# Patient Record
Sex: Female | Born: 1985 | Race: White | Hispanic: No | Marital: Married | Smoking: Never smoker
Health system: Southern US, Community
[De-identification: ages and names within clinical notes are randomized; demographics above are authoritative.]

## PROBLEM LIST (undated history)

## (undated) DIAGNOSIS — G43109 Migraine with aura, not intractable, without status migrainosus: Secondary | ICD-10-CM

## (undated) HISTORY — PX: WISDOM TOOTH EXTRACTION: SHX21

## (undated) HISTORY — DX: Migraine with aura, not intractable, without status migrainosus: G43.109

---

## 2015-07-11 ENCOUNTER — Encounter (HOSPITAL_COMMUNITY): Payer: Self-pay | Admitting: Emergency Medicine

## 2015-07-11 ENCOUNTER — Emergency Department (HOSPITAL_COMMUNITY)
Admission: EM | Admit: 2015-07-11 | Discharge: 2015-07-12 | Disposition: A | Discharge status: Home / Self Care | Payer: No Typology Code available for payment source | Attending: Emergency Medicine | Admitting: Emergency Medicine

## 2015-07-11 DIAGNOSIS — G43109 Migraine with aura, not intractable, without status migrainosus: Secondary | ICD-10-CM | POA: Diagnosis not present

## 2015-07-11 DIAGNOSIS — M542 Cervicalgia: Secondary | ICD-10-CM | POA: Insufficient documentation

## 2015-07-11 DIAGNOSIS — Z3202 Encounter for pregnancy test, result negative: Secondary | ICD-10-CM | POA: Diagnosis not present

## 2015-07-11 DIAGNOSIS — R51 Headache: Secondary | ICD-10-CM | POA: Diagnosis present

## 2015-07-11 LAB — CBC
HEMATOCRIT: 39 % (ref 36.0–46.0)
HEMOGLOBIN: 13 g/dL (ref 12.0–15.0)
MCH: 29.1 pg (ref 26.0–34.0)
MCHC: 33.3 g/dL (ref 30.0–36.0)
MCV: 87.2 fL (ref 78.0–100.0)
Platelets: 292 10*3/uL (ref 150–400)
RBC: 4.47 MIL/uL (ref 3.87–5.11)
RDW: 13.2 % (ref 11.5–15.5)
WBC: 8.7 10*3/uL (ref 4.0–10.5)

## 2015-07-11 LAB — URINALYSIS, ROUTINE W REFLEX MICROSCOPIC
Bilirubin Urine: NEGATIVE
GLUCOSE, UA: NEGATIVE mg/dL
HGB URINE DIPSTICK: NEGATIVE
Ketones, ur: NEGATIVE mg/dL
LEUKOCYTES UA: NEGATIVE
Nitrite: NEGATIVE
PH: 6 (ref 5.0–8.0)
Protein, ur: NEGATIVE mg/dL
Specific Gravity, Urine: 1.005 (ref 1.005–1.030)
Urobilinogen, UA: 0.2 mg/dL (ref 0.0–1.0)

## 2015-07-11 LAB — COMPREHENSIVE METABOLIC PANEL
ALK PHOS: 68 U/L (ref 38–126)
ALT: 20 U/L (ref 14–54)
AST: 27 U/L (ref 15–41)
Albumin: 4.1 g/dL (ref 3.5–5.0)
Anion gap: 8 (ref 5–15)
BILIRUBIN TOTAL: 0.7 mg/dL (ref 0.3–1.2)
BUN: 9 mg/dL (ref 6–20)
CALCIUM: 9.5 mg/dL (ref 8.9–10.3)
CO2: 26 mmol/L (ref 22–32)
CREATININE: 0.79 mg/dL (ref 0.44–1.00)
Chloride: 101 mmol/L (ref 101–111)
Glucose, Bld: 81 mg/dL (ref 65–99)
Potassium: 3.4 mmol/L — ABNORMAL LOW (ref 3.5–5.1)
SODIUM: 135 mmol/L (ref 135–145)
TOTAL PROTEIN: 6.9 g/dL (ref 6.5–8.1)

## 2015-07-11 LAB — POC URINE PREG, ED: Preg Test, Ur: NEGATIVE

## 2015-07-11 MED ORDER — ONDANSETRON 4 MG PO TBDP
4.0000 mg | ORAL_TABLET | Freq: Once | ORAL | Status: AC | PRN
  Administered 2015-07-11: 4 mg via ORAL

## 2015-07-11 MED ORDER — ONDANSETRON 4 MG PO TBDP
ORAL_TABLET | ORAL | Status: AC
  Filled 2015-07-11: qty 1

## 2015-07-11 NOTE — ED Provider Notes (Signed)
ghCSN: [id number]     Arrival date & time 07/11/15  2006 History  By signing my name below, I, Doreatha Martin, attest that this documentation has been prepared under the direction and in the presence of Rolland Porter, MD. Electronically Signed: Doreatha Martin, ED Scribe. 07/11/2015. 11:13 PM.    Chief Complaint  Patient presents with  . Diplopia  . Headache  . Nausea   The history is provided by the patient. No language interpreter was used.   HPI Comments: Bayleigh Loflin is a 29 y.o. female with no chronic medical conditions who presents to the Emergency Department complaining of an episode of visual disturbance, described as right-sided flashing circles of light with blurred vision in bilateral eyes, onset this evening after returning home from a trampoline park with associated HA at the base of the occiput, nausea, dizziness, neck pain, photophobia. Pt notes she was at the park for an hour and a half; no head injury or acute trauma. She states that her symptoms initially increased in intensity over time. She states that she still saw the lights when she closed her eyes. Pt notes that her symptoms have been subsiding and drifting more to the right since onset. No Hx of similar visual disturbance. Pt notes Hx of similar intermittent dull, aching, HAs at the base of the skull for 6 months, as well as similar neck pain, photophobia. Pt reports that her symptoms have now completely resolved. She notes that she has an appointment with Neurology in 5 days for the HAs. No everyday medications, no oral contraceptive use. Pt is a non-smoker. No FHx of cerebral hemorrhage, TIA, aneurisms. She denies focal weakness, numbness, difficulty ambulating.   History reviewed. No pertinent past medical history. History reviewed. No pertinent past surgical history. No family history on file. Social History  Substance Use Topics  . Smoking status: Never Smoker   . Smokeless tobacco: None  . Alcohol Use: No   OB History     No data available     Review of Systems  Constitutional: Negative for fever, chills, diaphoresis, appetite change and fatigue.  HENT: Negative for mouth sores, sore throat and trouble swallowing.   Eyes: Positive for photophobia and visual disturbance.  Respiratory: Negative for cough, chest tightness, shortness of breath and wheezing.   Cardiovascular: Negative for chest pain.  Gastrointestinal: Positive for nausea. Negative for vomiting, abdominal pain, diarrhea and abdominal distention.  Endocrine: Negative for polydipsia, polyphagia and polyuria.  Genitourinary: Negative for dysuria, frequency and hematuria.  Musculoskeletal: Positive for neck pain. Negative for gait problem.  Skin: Negative for color change, pallor and rash.  Neurological: Positive for dizziness and headaches. Negative for syncope, weakness, light-headedness and numbness.       +tingling to the neck and back  Hematological: Does not bruise/bleed easily.  Psychiatric/Behavioral: Negative for behavioral problems and confusion.   Allergies  Ceclor; Corn-containing products; Milk-related compounds; Soy allergy; and Sulfa antibiotics  Home Medications   Prior to Admission medications   Not on File   BP 101/62 mmHg  Pulse 77  Temp(Src) 97.9 F (36.6 C) (Oral)  Resp 18  Ht 5' 5.5" (1.664 m)  Wt 126 lb (57.153 kg)  BMI 20.64 kg/m2  SpO2 99%  LMP 06/25/2015 Physical Exam  Constitutional: She is oriented to person, place, and time. She appears well-developed and well-nourished. No distress.  HENT:  Head: Normocephalic.  Eyes: Conjunctivae are normal. Pupils are equal, round, and reactive to light. No scleral icterus.  Neck: Normal range  of motion. Neck supple. No thyromegaly present.  Cardiovascular: Normal rate and regular rhythm.  Exam reveals no gallop and no friction rub.   No murmur heard. Pulmonary/Chest: Effort normal and breath sounds normal. No respiratory distress. She has no wheezes. She has no  rales.  Abdominal: Soft. Bowel sounds are normal. She exhibits no distension. There is no tenderness. There is no rebound.  Musculoskeletal: Normal range of motion.  Neurological: She is alert and oriented to person, place, and time.  Skin: Skin is warm and dry. No rash noted.  Psychiatric: She has a normal mood and affect. Her behavior is normal.  Nursing note and vitals reviewed.  ED Course  Procedures (including critical care time) DIAGNOSTIC STUDIES: Oxygen Saturation is 100% on RA, normal by my interpretation.    COORDINATION OF CARE: 11:10 PM Discussed treatment plan with pt at bedside and pt agreed to plan.   Labs Review Labs Reviewed  COMPREHENSIVE METABOLIC PANEL - Abnormal; Notable for the following:    Potassium 3.4 (*)    All other components within normal limits  URINALYSIS, ROUTINE W REFLEX MICROSCOPIC (NOT [facility])  CBC  POC URINE PREG, ED    Imaging Review Ct Head Wo Contrast  07/12/2015  CLINICAL DATA:  29 year old female with intermittent headache for 7 months. New visual changes today. Initial encounter. EXAM: CT HEAD WITHOUT CONTRAST TECHNIQUE: Contiguous axial images were obtained from the base of the skull through the vertex without intravenous contrast. COMPARISON:  None. FINDINGS: Visualized paranasal sinuses and mastoids are clear. No osseous abnormality identified. Visualized orbit soft tissues are within normal limits. Visualized scalp soft tissues are within normal limits. Cerebral volume is normal. No midline shift, ventriculomegaly, mass effect, evidence of mass lesion, intracranial hemorrhage or evidence of cortically based acute infarction. Gray-white matter differentiation is within normal limits throughout the brain. No suspicious intracranial vascular hyperdensity. IMPRESSION: Normal noncontrast CT appearance of the brain. Electronically Signed   By: Odessa FlemingH  Hall M.D.   On: 07/12/2015 00:28   I have personally reviewed and evaluated these images and lab  results as part of my medical decision-making.  MDM   Final diagnoses:  Basilar migraine    Patient presents essentially symptom-free other than mild headache. Describes binocular diplopia. This would be against this being a retinal abnormality. I see no retinal abnormalities on exam. No structural abnormality is on CT. Normal repeat neurological exam with normal visual fields. Patient with scotoma nausea and posterior headache intermittent over several months. Likely basilar migraine. Has appointment with neurology on Monday. Appropriate for discharge without further studies.  Rolland PorterMark Monseratt Ledin, MD 07/12/15 [phone]

## 2015-07-11 NOTE — ED Notes (Signed)
MD to see and assess patient before RN assessment. 

## 2015-07-11 NOTE — ED Notes (Signed)
Onset today 1900 headache and bilateral double vision flashing light. Symptoms resolved however continued to feel nausea. Previously at a trampoline park today no trauma.

## 2015-07-12 ENCOUNTER — Emergency Department (HOSPITAL_COMMUNITY): Payer: No Typology Code available for payment source

## 2015-07-12 MED ORDER — ONDANSETRON 4 MG PO TBDP
4.0000 mg | ORAL_TABLET | Freq: Once | ORAL | Status: AC
  Administered 2015-07-12: 4 mg via ORAL
  Filled 2015-07-12: qty 1

## 2015-07-12 MED ORDER — IBUPROFEN 800 MG PO TABS
800.0000 mg | ORAL_TABLET | Freq: Once | ORAL | Status: AC
  Administered 2015-07-12: 800 mg via ORAL
  Filled 2015-07-12: qty 1

## 2015-07-12 MED ORDER — ONDANSETRON HCL 4 MG/2ML IJ SOLN
4.0000 mg | Freq: Once | INTRAMUSCULAR | Status: DC
  Filled 2015-07-12: qty 2

## 2015-07-12 NOTE — Discharge Instructions (Signed)

## 2015-07-12 NOTE — ED Notes (Signed)
Reviewed discharge instructions with patient, who verbalized understanding.

## 2015-07-16 ENCOUNTER — Encounter: Payer: Self-pay | Admitting: Neurology

## 2015-07-16 ENCOUNTER — Ambulatory Visit (INDEPENDENT_AMBULATORY_CARE_PROVIDER_SITE_OTHER): Payer: No Typology Code available for payment source | Admitting: Neurology

## 2015-07-16 VITALS — BP 107/73 | HR 75 | Ht 65.0 in | Wt 127.0 lb

## 2015-07-16 DIAGNOSIS — G43119 Migraine with aura, intractable, without status migrainosus: Secondary | ICD-10-CM

## 2015-07-16 DIAGNOSIS — G43109 Migraine with aura, not intractable, without status migrainosus: Secondary | ICD-10-CM | POA: Insufficient documentation

## 2015-07-16 HISTORY — DX: Migraine with aura, not intractable, without status migrainosus: G43.109

## 2015-07-16 MED ORDER — FOLIC ACID 1 MG PO TABS: 1.0000 mg | ORAL_TABLET | Freq: Every day | ORAL | Status: DC

## 2015-07-16 MED ORDER — TOPIRAMATE 25 MG PO TABS: ORAL_TABLET | ORAL | Status: DC

## 2015-07-16 NOTE — Progress Notes (Signed)
Reason for visit: Headache  Referring physician: Dr. Betty SwazilandJordan  Dannial MonarchMelissa A Moore is a 29 y.o. female  History of present illness:  [name] is a 29 year old right-handed white female with a history of headaches that began around January 2016. The patient was under some stress at the time of onset the headaches. The headaches are described as a pressure sensation that come up in the back of the head and are involved with some neck stiffness. The patient has some tingly sensations into the shoulders at times, no pain or discomfort down arms. The patient has virtually daily discomfort, the patient has had a period of up to 4 months when she was not able to work because of this. She may feel spacey with the headaches. Light and sound bother her, and bright flashing lights and some certain odors may worsen the headache. The patient has had at least one event associated with flashing lights in the vision that would migrate from one side to the next. The patient had an increase in the headache following this. The patient does not have headaches in the front of the head. She has gone to the emergency room on 07/12/2015, CT of the head was done and was unremarkable. The patient denies any weakness of the extremities, or difficulty controlling the bowels or the bladder. The patient takes some Advil for the headache, she has not been on any other medications. The patient is sent to this office for an evaluation. She has had some general improvement in the severity of discomfort recently, over the last 2 days she has not had any headache.  Past Medical History  Diagnosis Date  . 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 Wisdom tooth extraction      Family History  Problem Relation Age of Onset  . Hypertension Mother   . Dementia Maternal Grandmother   . Diabetes Maternal Grandmother     type II  . Colon cancer Maternal Grandfather   . Prostate cancer  Paternal Grandfather   . Migraines Other     Social history:  reports that she has never smoked. She has never used smokeless tobacco. She reports that she does not drink alcohol or use illicit drugs.  Medications:  Prior to Admission medications   Medication Sig Start Date End Date Taking? Authorizing Provider  LORazepam (ATIVAN) 0.5 MG tablet Take 0.5 mg by mouth daily as needed.    Yes Historical Provider, MD      Allergies  Allergen Reactions  . Ceclor [Cefaclor]   . Corn-Containing Products   . Milk-Related Compounds   . Soy Allergy   . Sulfa Antibiotics     ROS:  Out of a complete 14 system review of symptoms, the patient complains only of the following symptoms, and all other reviewed systems are negative.  Fevers, chills, fatigue Palpitations of the heart Ringing in the ears, spinning sensations Blurred vision Shortness of breath Feeling cold, increased thirst Muscle cramps, aching muscles Memory loss, confusion, headache, weakness, dizziness, tremor Anxiety, disinterest in activities Sleepiness  Blood pressure 107/73, pulse 75, height 5\' 5"  (1.651 m), weight 127 lb (57.607 kg), last menstrual period 06/25/2015.  Physical Exam  General: The patient is alert and cooperative at the time of the examination.  Eyes: Pupils are equal, round, and reactive to light. Discs are flat bilaterally.  Neck: The neck is supple, no carotid bruits are noted.  Respiratory: The respiratory examination is  clear.  Cardiovascular: The cardiovascular examination reveals a regular rate and rhythm, no obvious murmurs or rubs are noted.  Neuromuscular: Range of movement of the cervical spine was full. Significant crepitus/dislocation of the left temporomandibular joint is noted.  Skin: Extremities are without significant edema.  Neurologic Exam  Mental status: The patient is alert and oriented x 3 at the time of the examination. The patient has apparent normal recent and remote  memory, with an apparently normal attention span and concentration ability.  Cranial nerves: Facial symmetry is present. There is good sensation of the face to pinprick and soft touch bilaterally. The strength of the facial muscles and the muscles to head turning and shoulder shrug are normal bilaterally. Speech is well enunciated, no aphasia or dysarthria is noted. Extraocular movements are full. Visual fields are full. The tongue is midline, and the patient has symmetric elevation of the soft palate. No obvious hearing deficits are noted.  Motor: The motor testing reveals 5 over 5 strength of all 4 extremities. Good symmetric motor tone is noted throughout.  Sensory: Sensory testing is intact to pinprick, soft touch, vibration sensation, and position sense on all 4 extremities. No evidence of extinction is noted.  Coordination: Cerebellar testing reveals good finger-nose-finger and heel-to-shin bilaterally.  Gait and station: Gait is normal. Tandem gait is normal. Romberg is negative. No drift is seen.  Reflexes: Deep tendon reflexes are symmetric and normal bilaterally. Toes are downgoing bilaterally.   CT head 07/12/2015:  IMPRESSION: Normal noncontrast CT appearance of the brain.   Assessment/Plan:  1. Classic migraine  The patient appears to have at least one component of classic migraine headache, there may be some underlying tension headache as well. The patient will be placed on Topamax, she may take Advil for the headache. She will follow-up in 3-4 months. She will contact our office if further issues arise.  Marlan Palau MD 07/16/2015 8:04 PM  [facility] [address] [place], [place] [address]  Phone [phone] Fax [phone]

## 2015-07-16 NOTE — Patient Instructions (Addendum)
 We will start topamax for the headache.   Topamax (topiramate) is a seizure medication that has an FDA approval for seizures and for migraine headache. Potential side effects of this medication include weight loss, cognitive slowing, tingling in the fingers and toes, and carbonated drinks will taste bad. If any significant side effects are noted on this drug, please contact our office.  Migraine Headache A migraine headache is an intense, throbbing pain on one or both sides of your head. A migraine can last for 30 minutes to several hours. CAUSES  The exact cause of a migraine headache is not always known. However, a migraine may be caused when nerves in the brain become irritated and release chemicals that cause inflammation. This causes pain. Certain things may also trigger migraines, such as:  Alcohol.  Smoking.  Stress.  Menstruation.  Aged cheeses.  Foods or drinks that contain nitrates, glutamate, aspartame, or tyramine.  Lack of sleep.  Chocolate.  Caffeine.  Hunger.  Physical exertion.  Fatigue.  Medicines used to treat chest pain (nitroglycerine), birth control pills, estrogen, and some blood pressure medicines. SIGNS AND SYMPTOMS  Pain on one or both sides of your head.  Pulsating or throbbing pain.  Severe pain that prevents daily activities.  Pain that is aggravated by any physical activity.  Nausea, vomiting, or both.  Dizziness.  Pain with exposure to bright lights, loud noises, or activity.  General sensitivity to bright lights, loud noises, or smells. Before you get a migraine, you may get warning signs that a migraine is coming (aura). An aura may include:  Seeing flashing lights.  Seeing bright spots, halos, or zigzag lines.  Having tunnel vision or blurred vision.  Having feelings of numbness or tingling.  Having trouble talking.  Having muscle weakness. DIAGNOSIS  A migraine headache is often diagnosed based  on:  Symptoms.  Physical exam.  A CT scan or MRI of your head. These imaging tests cannot diagnose migraines, but they can help rule out other causes of headaches. TREATMENT Medicines may be given for pain and nausea. Medicines can also be given to help prevent recurrent migraines.  HOME CARE INSTRUCTIONS  Only take over-the-counter or prescription medicines for pain or discomfort as directed by your health care provider. The use of long-term narcotics is not recommended.  Lie down in a dark, quiet room when you have a migraine.  Keep a journal to find out what may trigger your migraine headaches. For example, write down:  What you eat and drink.  How much sleep you get.  Any change to your diet or medicines.  Limit alcohol consumption.  Quit smoking if you smoke.  Get 7-9 hours of sleep, or as recommended by your health care provider.  Limit stress.  Keep lights dim if bright lights bother you and make your migraines worse. SEEK IMMEDIATE MEDICAL CARE IF:   Your migraine becomes severe.  You have a fever.  You have a stiff neck.  You have vision loss.  You have muscular weakness or loss of muscle control.  You start losing your balance or have trouble walking.  You feel faint or pass out.  You have severe symptoms that are different from your first symptoms. MAKE SURE YOU:   Understand these instructions.  Will watch your condition.  Will get help right away if you are not doing well or get worse.   This information is not intended to replace advice given to you by your health care provider. Make   Make sure you discuss any questions you have with your health care provider.   Document Released: 09/15/2005 Document Revised: 10/06/2014 Document Reviewed: 05/23/2013 Elsevier Interactive Patient Education [employer]2016 Elsevier Inc.

## 2015-10-08 ENCOUNTER — Ambulatory Visit: Payer: No Typology Code available for payment source | Admitting: Neurology

## 2015-10-22 ENCOUNTER — Encounter: Payer: Self-pay | Admitting: Neurology

## 2015-10-22 ENCOUNTER — Ambulatory Visit (INDEPENDENT_AMBULATORY_CARE_PROVIDER_SITE_OTHER): Payer: No Typology Code available for payment source | Admitting: Neurology

## 2015-10-22 VITALS — BP 84/58 | HR 78 | Ht 65.5 in | Wt 124.0 lb

## 2015-10-22 DIAGNOSIS — M5481 Occipital neuralgia: Secondary | ICD-10-CM | POA: Diagnosis not present

## 2015-10-22 DIAGNOSIS — H539 Unspecified visual disturbance: Secondary | ICD-10-CM

## 2015-10-22 DIAGNOSIS — R202 Paresthesia of skin: Secondary | ICD-10-CM | POA: Diagnosis not present

## 2015-10-22 DIAGNOSIS — G43109 Migraine with aura, not intractable, without status migrainosus: Secondary | ICD-10-CM

## 2015-10-22 NOTE — Progress Notes (Signed)
NEUROLOGY CONSULTATION NOTE  Jackie Moore MRN: [id number] DOB: 06-26-1986  Referring provider: Dr. Swaziland Primary care provider: Dr. Swaziland  Reason for consult:  Headache, visual disturbance  HISTORY OF PRESENT ILLNESS: Jackie Moore is a 30 year old right-handed female who presents for second opinion regarding headaches.  History obtained by patient and ED note.  Labs and imaging of head CT reviewed.  She and her husband moved here from [facility] about 3 months ago.  When she had moved into a new apartment in [place] in January 2016, she began experiencing multiple symptomatology.  She noted neck tension, which later developed into electric shooting pain in the occiput, accompanied by paresthesias on the top and back of the head.  Due to the discomfort, she began experiencing panic attacks.  She then developed altered sensorium.  She felt more withdrawn.  She had thoughts that she was being stabbed in the leg, despite not actually feeling any pain.  She also would have the false belief that people around her were experiencing stabbing pain in their legs.  She has had increased photosensitivity and floaters when she goes outside.  When she first moved here, she went to the ED on 07/11/15 for an episode of visual disturbance where she noted shimmering light moving to the right visual field of both eyes.  It lasted about 45 minutes and resolved.  CT of the head was normal.  CBC and CMP were unremarkable.  When she moved to [place], most of the symptoms resolved.  However, she still notes the shooting occipital pain and tingling in the back of her head.  It lasts about 30 minutes and occurs once a day, usually while she is sitting.  She also notes tingling in the 5th digits of both hands, which radiates up the arms.  She reports prior history of migraines in college.  Her mother had migraines.  Her great grandmother had cerebral aneurysm.  She denies history of seizures.  PAST MEDICAL  HISTORY: Past Medical History  Diagnosis Date  . 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 Wisdom tooth extraction      MEDICATIONS: No current outpatient prescriptions on file prior to visit.   No current facility-administered medications on file prior to visit.    ALLERGIES: Allergies  Allergen Reactions  . Ceclor [Cefaclor]   . Corn-Containing Products   . Milk-Related Compounds   . Soy Allergy   . Sulfa Antibiotics     FAMILY HISTORY: Family History  Problem Relation Age of Onset  . Hypertension Mother   . Dementia Maternal Grandmother   . Diabetes Maternal Grandmother     type II  . Colon cancer Maternal Grandfather   . Prostate cancer Paternal Grandfather   . Migraines Other     SOCIAL HISTORY: Social History   Social History  . Marital Status: Married    Spouse Name: N/A  . Number of Children: 0  . Years of Education: bachelor's   Occupational History  . self-employed    Social History Main Topics  . Smoking status: Never Smoker   . Smokeless tobacco: Never Used  . Alcohol Use: No  . Drug Use: No  . Sexual Activity: Not on file   Other Topics Concern  . Not on file   Social History Narrative   Patient does not drink caffeine.   Patient is right handed.     REVIEW OF SYSTEMS: Constitutional:  No fevers, chills, or sweats, no generalized fatigue, change in appetite Eyes: No visual changes, double vision, eye pain Ear, nose and throat: No hearing loss, ear pain, nasal congestion, sore throat Cardiovascular: No chest pain, palpitations Respiratory:  No shortness of breath at rest or with exertion, wheezes GastrointestinaI: No nausea, vomiting, diarrhea, abdominal pain, fecal incontinence Genitourinary:  No dysuria, urinary retention or frequency Musculoskeletal:  No neck pain, back pain Integumentary: No rash, pruritus, skin lesions Neurological: as above Psychiatric: No  depression, insomnia, anxiety Endocrine: No palpitations, fatigue, diaphoresis, mood swings, change in appetite, change in weight, increased thirst Hematologic/Lymphatic:  No anemia, purpura, petechiae. Allergic/Immunologic: no itchy/runny eyes, nasal congestion, recent allergic reactions, rashes  PHYSICAL EXAM: Filed Vitals:   10/22/15 1304  BP: 84/58  Pulse: 78   General: No acute distress.  Patient appears well-groomed.  Head:  Normocephalic/atraumatic Eyes:  fundi unremarkable, without vessel changes, exudates, hemorrhages or papilledema. Neck: supple, no paraspinal tenderness, full range of motion Back: No paraspinal tenderness Heart: regular rate and rhythm Lungs: Clear to auscultation bilaterally. Vascular: No carotid bruits. Neurological Exam: Mental status: alert and oriented to person, place, and time, recent and remote memory intact, fund of knowledge intact, attention and concentration intact, speech fluent and not dysarthric, language intact. Cranial nerves: CN I: not tested CN II: pupils equal, round and reactive to light, visual fields intact, fundi unremarkable, without vessel changes, exudates, hemorrhages or papilledema. CN III, IV, VI:  full range of motion, no nystagmus, no ptosis CN V: facial sensation intact CN VII: upper and lower face symmetric CN VIII: hearing intact CN IX, X: gag intact, uvula midline CN XI: sternocleidomastoid and trapezius muscles intact CN XII: tongue midline Bulk & Tone: normal, no fasciculations. Motor:  5/5 throughout Sensation: temperature and vibration sensation intact. Deep Tendon Reflexes:  2+ throughout, toes downgoing.  Finger to nose testing:  Without dysmetria.  Heel to shin:  Without dysmetria.  Gait:  Normal station and stride.  Able to turn and tandem walk. Romberg negative. Positive Tinel's sign at elbows.  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 Numbness and tingling in upper  extremities.  Consider ulnar neuropathy  PLAN: 1.  Due to the new headaches, visual disturbance, cognitive disturbance and paresthesias, will get MRI of brain with and without contrast.  2.  Consider occipital nerve block if gets worse 3.  For hands, advised not to lean on elbows.  Consider NCV-EMG if gets worse 4.  If MRI unremarkable, follow up as needed.  Thank you for allowing me to take part in the care of this patient.  Shon Millet, DO  CC:  Betty Swaziland, MD

## 2015-10-22 NOTE — Patient Instructions (Addendum)
I think the tingling and electric shocks in the back of your head may be occipital neuralgia.  If it becomes more frequent, I can give you an injection to help relieve the inflammation of that nerve in the back of the head.  The tingling in the hands may be due to pinching of the nerve around the elbow.  Be careful not to lean on your elbows.  If it gets worse, we can order a nerve study We will get MRI of the brain with and without contrast. Follow up as needed

## 2015-10-22 NOTE — Progress Notes (Signed)
Chart forwarded.  

## 2015-10-24 ENCOUNTER — Telehealth: Payer: Self-pay

## 2015-10-24 ENCOUNTER — Ambulatory Visit: Payer: Self-pay | Admitting: Neurology

## 2015-10-24 NOTE — Telephone Encounter (Signed)
Patient did not come to a f/u appointment today.  

## 2015-11-01 ENCOUNTER — Encounter: Payer: Self-pay | Admitting: Neurology

## 2015-11-26 ENCOUNTER — Telehealth: Payer: Self-pay | Admitting: Neurology

## 2015-11-26 DIAGNOSIS — R9389 Abnormal findings on diagnostic imaging of other specified body structures: Secondary | ICD-10-CM

## 2015-11-26 NOTE — Addendum Note (Signed)
Addended by: Sheilah Mins A on: 11/26/2015 02:45 PM   Modules accepted: Orders

## 2015-11-26 NOTE — Telephone Encounter (Signed)
Patient called back and left vm w/ front desk stating she will be able to go get MRI w/ contrast. Please see next telephone note.

## 2015-11-26 NOTE — Telephone Encounter (Signed)
Discussed MRI results.  It showed small nonspecific white matter changes.  I would like to get MRI of brain WITH contrast to further evaluate.  I would also like to schedule repeat MRI of brain WITH AND WITHOUT contrast in 6 months with office follow up soon afterwards.

## 2015-11-26 NOTE — Telephone Encounter (Signed)
VM-PT left message that she can get another MRI/Dawn CB# [phone]

## 2015-11-26 NOTE — Telephone Encounter (Signed)
Attempted to order. Called patient. She states order was supposed to be w/ and w/o but she cancelled the w/ at the site due to concerns about contrast dye. States they told her she can come back in and have the contrast portion done if she needs since that is how original order stated scan. Pt will contact [facility] to follow up and will call me back.

## 2015-11-27 NOTE — Telephone Encounter (Signed)
Dr. Everlena Cooper requested MRI cervical spine w/ and w/o be added. Spoke with patient. She is aware. Will call GI herself this afternoon to schedule both.

## 2015-11-27 NOTE — Telephone Encounter (Signed)
Pt uses [facility] Not [facility]. MRIs printed and faxed to [facility].

## 2015-11-27 NOTE — Addendum Note (Signed)
Addended by: Sheilah Mins A on: 11/27/2015 11:06 AM   Modules accepted: Orders

## 2015-11-27 NOTE — Addendum Note (Signed)
Addended by: Sheilah Mins A on: 11/27/2015 11:58 AM   Modules accepted: Orders

## 2015-12-05 ENCOUNTER — Telehealth: Payer: Self-pay

## 2015-12-06 NOTE — Telephone Encounter (Signed)
Opened in error

## 2015-12-07 ENCOUNTER — Inpatient Hospital Stay: Admission: RE | Admit: 2015-12-07 | Payer: No Typology Code available for payment source | Source: Ambulatory Visit

## 2015-12-07 ENCOUNTER — Other Ambulatory Visit: Payer: No Typology Code available for payment source

## 2015-12-19 ENCOUNTER — Telehealth: Payer: Self-pay

## 2015-12-19 NOTE — Telephone Encounter (Signed)
-----   Message from Drema DallasAdam R Jaffe, DO sent at 12/19/2015 12:02 PM EDT ----- MRI of brain shows that the spots do not light up.  MRI of cervical spine looks okay.  At this point, MRI findings are of uncertain significance.  I would like to repeat MRI of brain with and without contrast in 6 months and follow up with me right afterwards

## 2015-12-19 NOTE — Telephone Encounter (Signed)
Left message on machine for pt to return call to the office.  

## 2016-06-03 ENCOUNTER — Telehealth: Payer: Self-pay

## 2016-06-03 DIAGNOSIS — R9089 Other abnormal findings on diagnostic imaging of central nervous system: Secondary | ICD-10-CM

## 2016-06-03 NOTE — Telephone Encounter (Signed)
MRI order printed and faxed to [facility] phone: [phone] Fax: [phone].  Left message on pt's machine to return call.

## 2016-06-03 NOTE — Telephone Encounter (Signed)
-----   Message from Jada A Fox, CMA sent at 11/26/2015  2:32 PM EST ----- Regarding: MRI MRI brain w/ and w/o 

## 2016-06-03 NOTE — Telephone Encounter (Signed)
-----   Message from Richarda OverlieJada A Cullin Dishman, [place] sent at 11/26/2015  2:32 PM EST ----- Regarding: MRI MRI brain w/ and w/o

## 2016-06-03 NOTE — Telephone Encounter (Signed)
Attempted to contact pt again. Pt's guarentor's DOB is not in chart. Can not initiate any P.A. Without info. Will need to collect when pt calls back.

## 2016-06-12 ENCOUNTER — Telehealth: Payer: Self-pay

## 2016-06-12 NOTE — Telephone Encounter (Signed)
Letter sent.

## 2016-06-12 NOTE — Telephone Encounter (Signed)
Have been attempting to reach pt to find out guarantor's DOB. Have been unsuccessful. Many messages left. Will draft and send letter.

## 2017-09-11 IMAGING — CT CT HEAD W/O CM
2 series · 16 of 30 positions shown, 20 images · non-contrast
Comparison: None.

CLINICAL DATA: 28-year-old female with intermittent headache for 7
months. New visual changes today. Initial encounter.

EXAM:
CT HEAD WITHOUT CONTRAST
TECHNIQUE: Contiguous axial images were obtained from the base of the skull
through the vertex without intravenous contrast.

[Series 201: head w/o, idose (1) · axial · non-contrast · 0.49mm/px · z∈[+91,+211]mm · 13 of 28 slices shown, 17 images]
[im 2/28  brain]
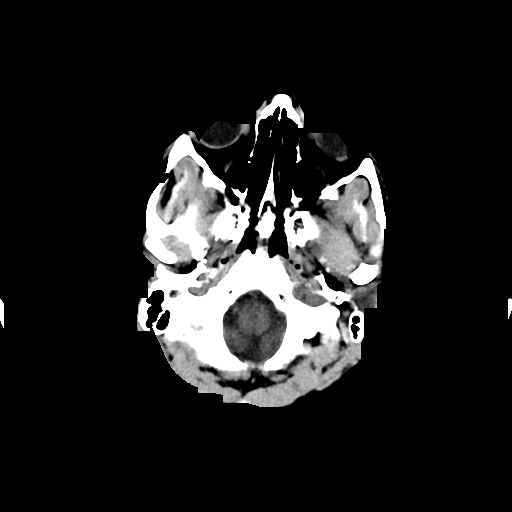
[im 2/28  bone]
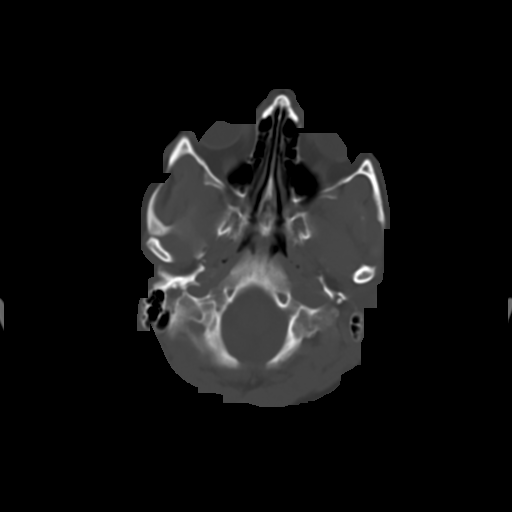
[im 4/28  brain]
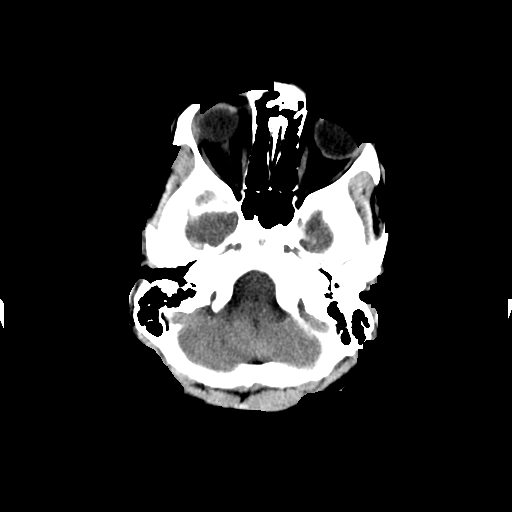
[im 6/28  brain]
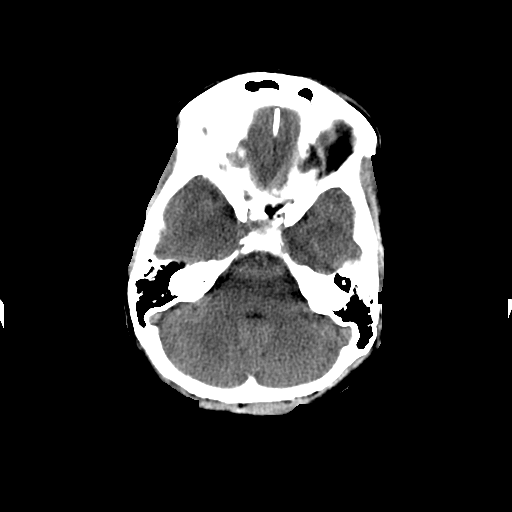
[im 8/28  brain]
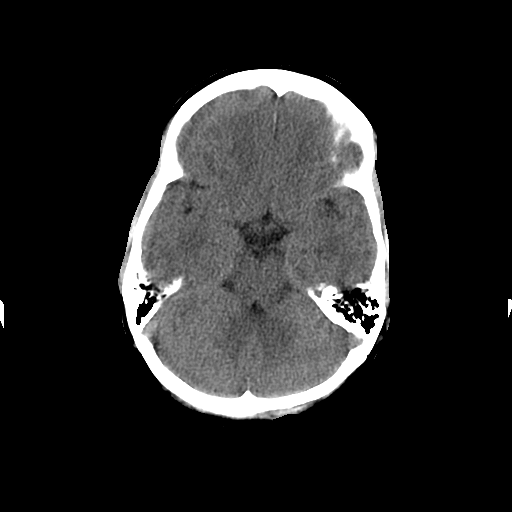
[im 10/28  brain]
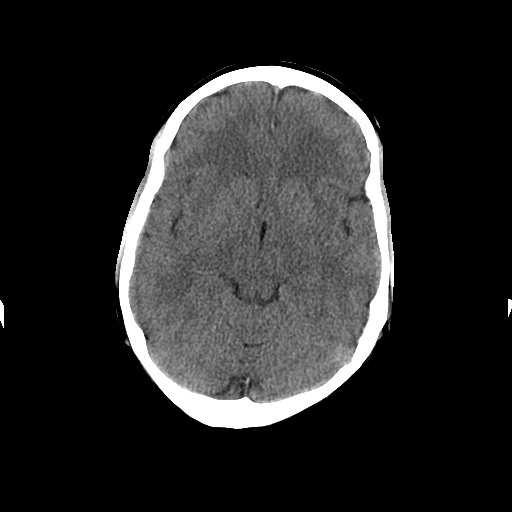
[im 10/28  bone]
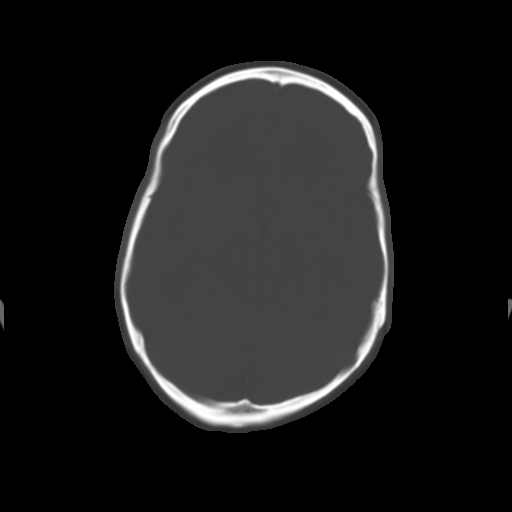
[im 12/28  brain]
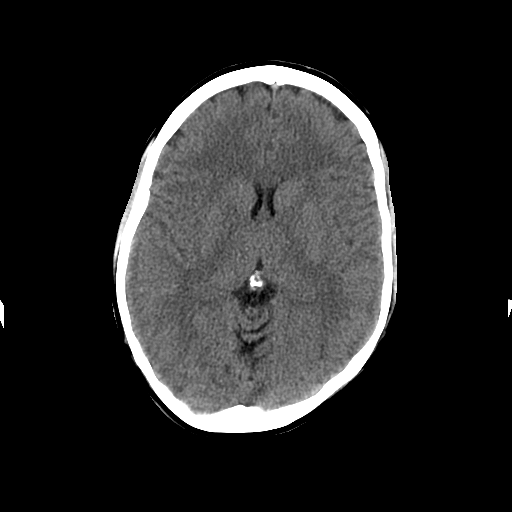
[im 14/28  brain]
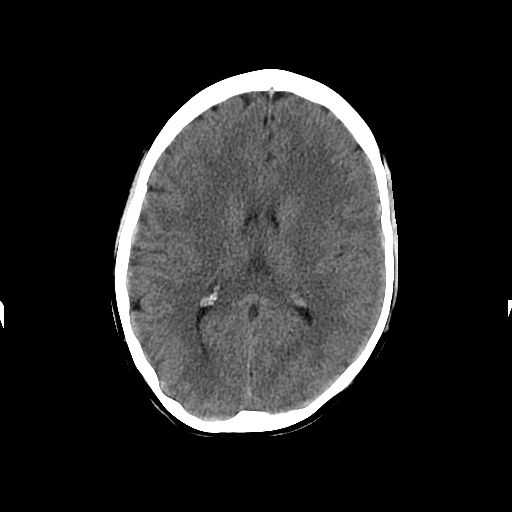
[im 16/28  brain]
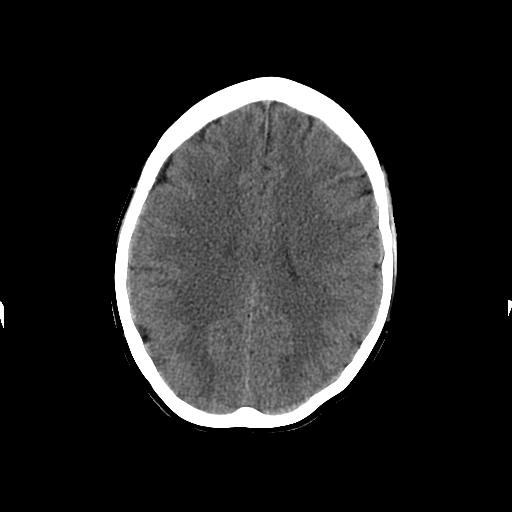
[im 18/28  brain]
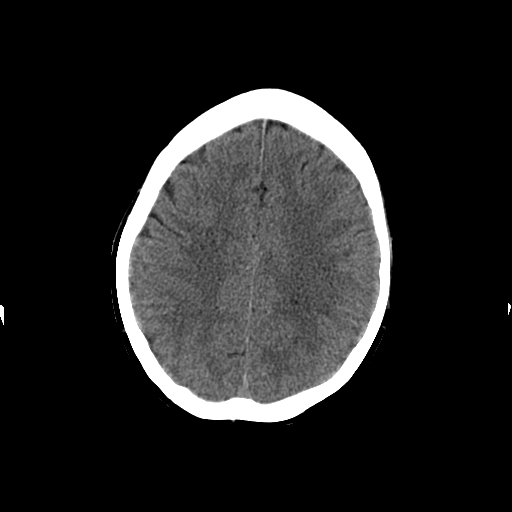
[im 18/28  bone]
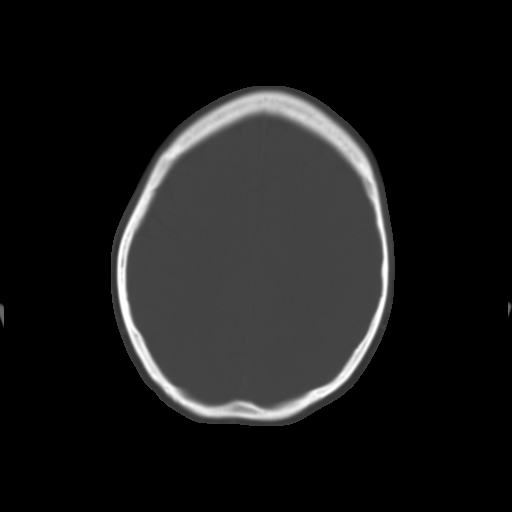
[im 20/28  brain]
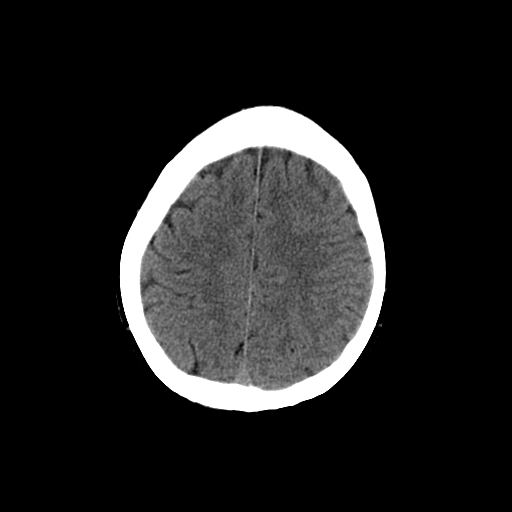
[im 22/28  brain]
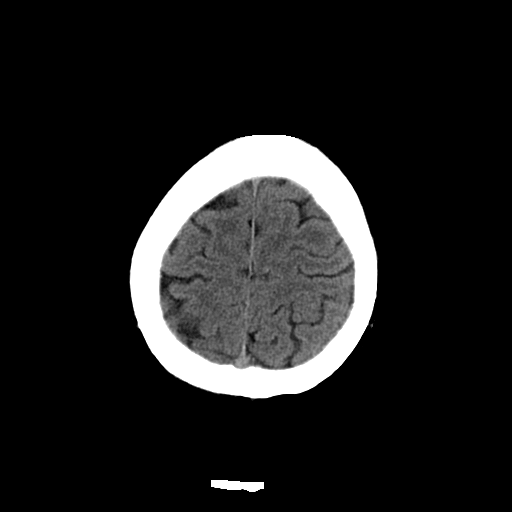
[im 24/28  brain]
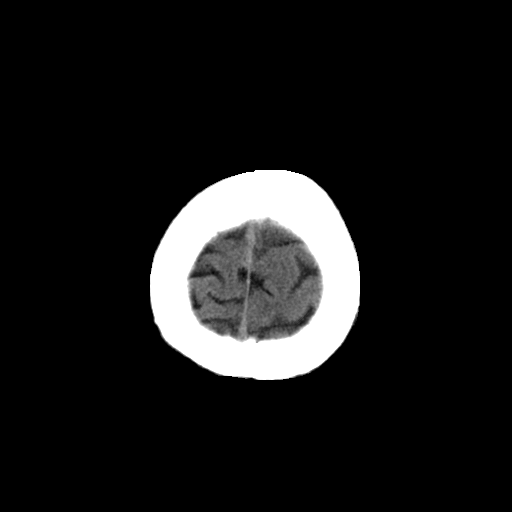
[im 26/28  brain]
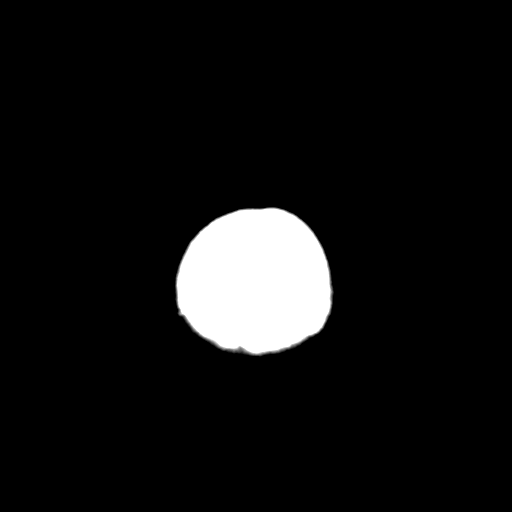
[im 26/28  bone]
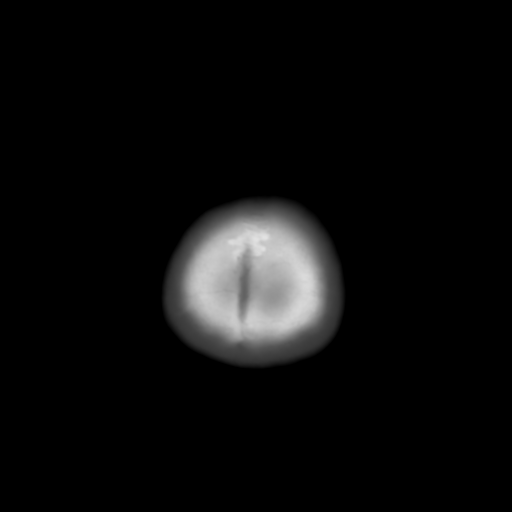

[Series 202: head w/o bone, idose (1) · axial · non-contrast · 0.49mm/px · z∈[+91,+131]mm · 3 of 28 slices shown]
[im 2/28  bone]
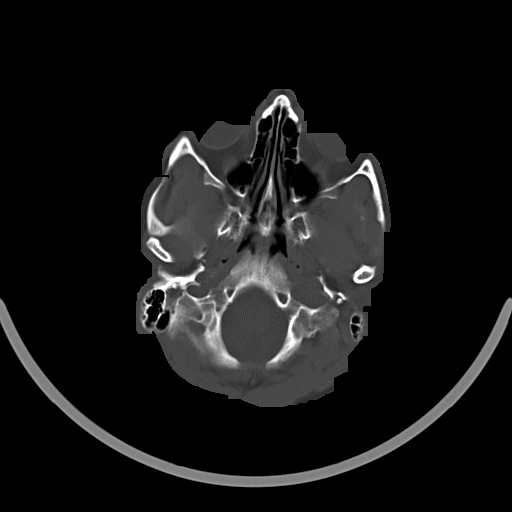
[im 6/28  bone]
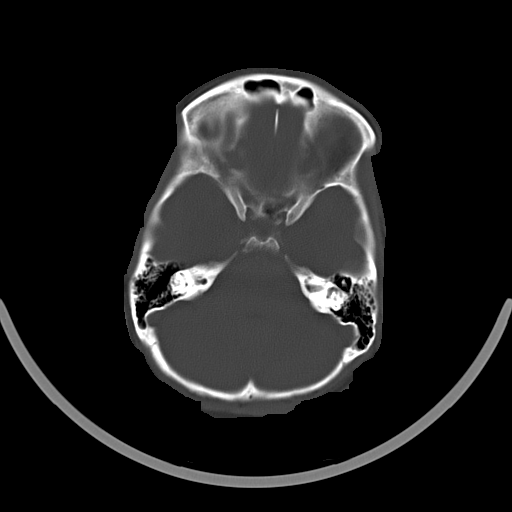
[im 10/28  bone]
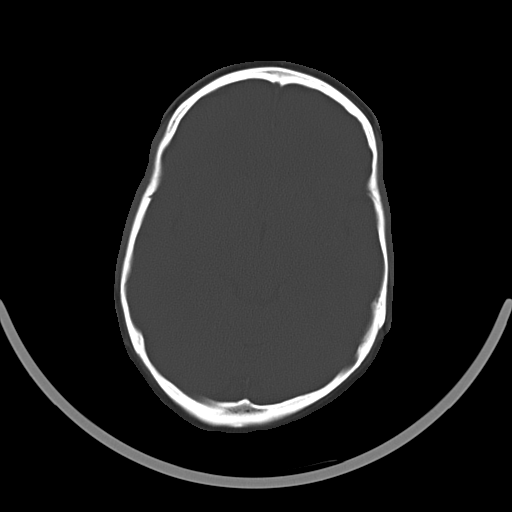

[16 of 30 positions shown; findings below may reference images not displayed]

FINDINGS: Visualized paranasal sinuses and mastoids are clear. No osseous
abnormality identified. Visualized orbit soft tissues are within
normal limits. Visualized scalp soft tissues are within normal
limits.

Cerebral volume is normal. No midline shift, ventriculomegaly, mass
effect, evidence of mass lesion, intracranial hemorrhage or evidence
of cortically based acute infarction. Gray-white matter
differentiation is within normal limits throughout the brain. No
suspicious intracranial vascular hyperdensity.
IMPRESSION: Normal noncontrast CT appearance of the brain.
